# Patient Record
Sex: Male | Born: 1975 | Race: Black or African American | Hispanic: No | Marital: Married | Smoking: Never smoker
Health system: Southern US, Community
[De-identification: ages and names within clinical notes are randomized; demographics above are authoritative.]

## PROBLEM LIST (undated history)

## (undated) HISTORY — PX: HERNIA REPAIR: SHX51

---

## 1998-01-12 ENCOUNTER — Emergency Department (HOSPITAL_COMMUNITY): Admission: EM | Admit: 1998-01-12 | Discharge: 1998-01-12 | Payer: Self-pay | Admitting: Emergency Medicine

## 1998-01-12 ENCOUNTER — Encounter: Payer: Self-pay | Admitting: Emergency Medicine

## 2005-11-24 ENCOUNTER — Ambulatory Visit (HOSPITAL_COMMUNITY): Admission: RE | Admit: 2005-11-24 | Discharge: 2005-11-24 | Payer: Self-pay | Admitting: Internal Medicine

## 2005-11-24 ENCOUNTER — Ambulatory Visit: Payer: Self-pay | Admitting: Internal Medicine

## 2007-01-08 ENCOUNTER — Encounter: Payer: Self-pay | Admitting: Internal Medicine

## 2007-01-08 DIAGNOSIS — M545 Low back pain: Secondary | ICD-10-CM

## 2007-04-02 ENCOUNTER — Emergency Department (HOSPITAL_COMMUNITY): Admission: EM | Admit: 2007-04-02 | Discharge: 2007-04-02 | Payer: Self-pay | Admitting: Emergency Medicine

## 2010-07-15 NOTE — Assessment & Plan Note (Signed)
[facility]                             PRIMARY CARE OFFICE NOTE   Jacob Li, Jacob Li                       MRN:          [id number]  DATE:11/24/2005                            DOB:          06-11-1975    CHIEF COMPLAINT:  New patient to practice.   HISTORY OF PRESENT ILLNESS:  Patient is a 35 year old African-American male,  here to establish primary care.  He has not had a primary care physician in  the past.  His main complaint today is low back pain which he has had  worsening over the last 2 months.  Patient states that he injured his back  when he was in high school during a football accident, and ever since then  has had periodic flare-ups of low back pain.  Patient does not recall any  recent trauma or injury.  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  Patient relays symptoms in his lower back that  occasionally radiate down his left buttock and left leg.  There is some left  leg/buttock numbness.  This occurred once after he walked on a treadmill for  approximately 30 minutes.  In addition, he states that when his bladder is  full and right before defecation he has senses of pressure-like sensation in  his lower back.  Patient denies any lower extremity weakness.  No history of  bowel or bladder incontinence.   He has not taken any over-the-counter medications, has not had an x-ray of  his back in the past.   No other hospitalizations or serious illnesses, does not take any  medications, and DOES NOT REPORT ANY ALLERGIES TO MEDICATIONS.   SOCIAL HISTORY:  Patient is married, has 3 children; ages 18, 47, and 2, and  works for [employer].  There is some mild lifting  duty/responsibilities for employment.   FAMILY HISTORY:  Mother and father are both in their mid-50s.  Father is  known to be hypertensive.  He has a cousin that is diabetic.  No family  history of colon cancer or prostate cancer.   HABITS:  He seldom drinks.  Denies any tobacco use.  No recreational drug  use.   REVIEW OF SYMPTOMS:  No fever or chills.  No HEENT symptoms.  No chest pain,  shortness of breath.  No heartburn, nausea, vomiting, constipation,  diarrhea.  Patient denies any polyuria, polydipsia.  He has not had any  recent blood work to check for diabetes.  He has had issues with obesity for  the last 5 years.   PHYSICAL EXAMINATION:  VITAL SIGNS:  Height 5 feet 10, weight 276 pounds,  temperature 98.7, pulse 91, BP 126/79.  GENERAL:  The patient is a somewhat overweight 35 year old pleasant African-  American male, no apparent distress.  HEENT:  Normocephalic, atraumatic.  Pupils were equal and reactive to light  bilaterally.  Extraocular motility was intact.  Patient was anicteric.  Conjunctiva was within normal limits. Oropharyngeal exam was unremarkable.  NECK:  Somewhat thick but no adenopathy, carotid bruit, or thyromegaly.  CHEST:  Normal respiratory effort.  Chest was clear to auscultation  bilaterally.  No rhonchi, rales or wheezing.  CARDIOVASCULAR:  Regular rate, rhythm.  No significant murmurs, rubs, or  gallops appreciated.  ABDOMEN:  Somewhat protuberant, nontender.  Positive bowel sounds.  No  organomegaly.  MUSCULOSKELETAL:  Patient had full range of motion with lumbar flexion and  extension.  I could not elicit any pain with toe-touching on extension of  his lumbar spine, nor with side-bending or rotation.  Patient did not have  any palpable tenderness to his lumbosacral spine.  The muscle strength was  5/5 throughout.  Reflexes are 0 to 1 upper extremity,  +1 to 2 patellar, and  0 to 1 Achilles, equal and symmetric bilaterally.  Patient had normal  sensation to his lower extremities and had no pain with straight-leg raising  test.  His hamstrings had limited range of motion to approximately 35  degrees bilateral.   IMPRESSION/RECOMMENDATIONS:  1. Chronic intermittent low back  pain.  2. Obesity.  3. Health maintenance.   RECOMMENDATIONS:  Patient is to take occasional over-the-counter  nonsteroidals.  He was also given a prescription for Flexeril 5 mg to be  taken at bedtime.  We will obtain x-ray of his lumbar spine to rule out any  anatomic/bony abnormality.  Followup time will be in approximately 1 to 1-  1/2 weeks.  Patient will likely benefit from physical therapy if there is no  bony abnormality.  We briefly discussed hamstring stretching exercises as  well as abdominal exercises.   We also recommended having screening labs including his cholesterol and  fasting blood sugar before followup visit.       Barbette Hair. Artist Pais, DO      RDY/MedQ  DD:  11/24/2005  DT:  11/27/2005  Job #:  [id number]

## 2010-11-25 ENCOUNTER — Inpatient Hospital Stay (INDEPENDENT_AMBULATORY_CARE_PROVIDER_SITE_OTHER)
Admission: RE | Admit: 2010-11-25 | Discharge: 2010-11-25 | Disposition: A | Discharge status: Home / Self Care | Payer: Self-pay | Source: Ambulatory Visit | Attending: Family Medicine | Admitting: Family Medicine

## 2010-11-25 DIAGNOSIS — M549 Dorsalgia, unspecified: Secondary | ICD-10-CM

## 2010-11-25 DIAGNOSIS — M461 Sacroiliitis, not elsewhere classified: Secondary | ICD-10-CM

## 2014-10-23 ENCOUNTER — Ambulatory Visit: Payer: Self-pay | Admitting: Surgery

## 2014-10-23 NOTE — H&P (Signed)
History of Present Illness Jacob Li. Evetta Renner MD; 10/23/2014 9:54 AM) Patient words: neck lipoma.  The patient is a 39 year old male who presents with a complaint of Mass. This is a healthy 39 year old male referred by Charlies Silvers PA-C for evaluation of a tender lipoma of the right shoulder. The patient states that this has been present for about 6 months and has become slightly larger. It has never been infected. The patient has a lot of tenderness and tightness in both shoulders and occasionally gets tingling in his left hand. 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 I told him that the left hand symptoms are not likely to be related to a lipoma of the right shoulder. Other Problems Gilmer Mor, CMA; 10/23/2014 9:34 AM) Back Pain  Past Surgical History Gilmer Mor, CMA; 10/23/2014 9:34 AM) Oral Surgery Ventral / Umbilical Hernia Surgery Bilateral.  Diagnostic Studies History Gilmer Mor, CMA; 10/23/2014 9:34 AM) Colonoscopy >10 years ago  Allergies Gilmer Mor, CMA; 10/23/2014 9:35 AM) No Known Drug Allergies 10/23/2014  Medication History Wynona Luna, MD; 10/23/2014 9:54 AM) No Current Medications Medications Reconciled  Social History Gilmer Mor, CMA; 10/23/2014 9:34 AM) Alcohol use Occasional alcohol use. Illicit drug use Recently quit drug use. No caffeine use Tobacco use Former smoker.  Family History Gilmer Mor, CMA; 10/23/2014 9:34 AM) Hypertension Father.     Review of Systems Lamar Laundry Bynum CMA; 10/23/2014 9:34 AM) General Not Present- Appetite Loss, Chills, Fatigue, Fever, Night Sweats, Weight Gain and Weight Loss. Skin Not Present- Change in Wart/Mole, Dryness, Hives, Jaundice, New Lesions, Non-Healing Wounds, Rash and Ulcer. HEENT Present- Seasonal Allergies. Not Present- Earache, Hearing Loss, Hoarseness, Nose Bleed, Oral Ulcers, Ringing in the Ears, Sinus Pain, Sore Throat, Visual Disturbances,  Wears glasses/contact lenses and Yellow Eyes. Respiratory Present- Snoring. Not Present- Bloody sputum, Chronic Cough, Difficulty Breathing and Wheezing. Breast Not Present- Breast Mass, Breast Pain, Nipple Discharge and Skin Changes. Cardiovascular Not Present- Chest Pain, Difficulty Breathing Lying Down, Leg Cramps, Palpitations, Rapid Heart Rate, Shortness of Breath and Swelling of Extremities. Gastrointestinal Not Present- Abdominal Pain, Bloating, Bloody Stool, Change in Bowel Habits, Chronic diarrhea, Constipation, Difficulty Swallowing, Excessive gas, Gets full quickly at meals, Hemorrhoids, Indigestion, Nausea, Rectal Pain and Vomiting. Male Genitourinary Not Present- Blood in Urine, Change in Urinary Stream, Frequency, Impotence, Nocturia, Painful Urination, Urgency and Urine Leakage. Musculoskeletal Present- Joint Stiffness. Not Present- Back Pain, Joint Pain, Muscle Pain, Muscle Weakness and Swelling of Extremities. Neurological Not Present- Decreased Memory, Fainting, Headaches, Numbness, Seizures, Tingling, Tremor, Trouble walking and Weakness. Psychiatric Not Present- Anxiety, Bipolar, Change in Sleep Pattern, Depression, Fearful and Frequent crying. Endocrine Not Present- Cold Intolerance, Excessive Hunger, Hair Changes, Heat Intolerance, Hot flashes and New Diabetes. Hematology Not Present- Easy Bruising, Excessive bleeding, Gland problems, HIV and Persistent Infections.  Vitals (Sonya Bynum CMA; 10/23/2014 9:35 AM) 10/23/2014 9:34 AM Weight: 252.6 lb Height: 69in Body Surface Area: 2.36 m Body Mass Index: 37.3 kg/m Temp.: 60F(Temporal)  Pulse: 81 (Regular)  BP: 134/78 (Sitting, Left Arm, Standard)     Physical Exam Molli Hazard K. Rc Amison MD; 10/23/2014 9:54 AM)  The physical exam findings are as follows: Note:WDWN in NAD HEENT: EOMI, sclera anicteric Neck: No masses, no thyromegaly Shoulder - right over trapezius; 3 cm subcutaneous mass; no overlying skin  changes; no inflammation Lungs: CTA bilaterally; normal respiratory effort CV: Regular rate and rhythm; no murmurs Abd: +bowel sounds, soft, non-tender, no masses Ext: Well-perfused;  no edema Skin: Warm, dry; no sign of jaundice    Assessment & Plan Jacob Li. 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 The surgical procedure has been discussed with the patient. Potential risks, benefits, alternative treatments, and expected outcomes have been explained. All of the patient's questions at this time have been answered. The likelihood of reaching the patient's treatment goal is good. The patient understand the proposed surgical procedure and wishes to proceed.  Jacob Li. Corliss Skains, MD, [facility]  General/ Trauma Surgery  10/23/2014 9:55 AM

## 2016-02-28 HISTORY — PX: CYST REMOVAL NECK: SHX6281

## 2018-08-14 ENCOUNTER — Emergency Department (HOSPITAL_COMMUNITY)
Admission: EM | Admit: 2018-08-14 | Discharge: 2018-08-14 | Disposition: A | Discharge status: Home / Self Care | Payer: Managed Care, Other (non HMO) | Attending: Emergency Medicine | Admitting: Emergency Medicine

## 2018-08-14 ENCOUNTER — Other Ambulatory Visit: Payer: Self-pay

## 2018-08-14 ENCOUNTER — Encounter (HOSPITAL_COMMUNITY): Payer: Self-pay | Admitting: Emergency Medicine

## 2018-08-14 DIAGNOSIS — M545 Low back pain: Secondary | ICD-10-CM | POA: Diagnosis present

## 2018-08-14 DIAGNOSIS — M5431 Sciatica, right side: Secondary | ICD-10-CM | POA: Diagnosis not present

## 2018-08-14 MED ORDER — PREDNISONE 10 MG (21) PO TBPK: ORAL_TABLET | Freq: Every day | ORAL | 0 refills | Status: AC

## 2018-08-14 NOTE — ED Notes (Signed)
Discharge instructions and prescription discussed with Pt. Pt verbalized understanding. Pt stable and ambulatory.    

## 2018-08-14 NOTE — ED Provider Notes (Addendum)
[facility] EMERGENCY DEPARTMENT Provider Note   CSN: [id number] Arrival date & time: 08/14/18  1705    History   Chief Complaint Chief Complaint  Patient presents with  . Back Pain    HPI Jacob Li is a 43 y.o. male.     HPI  Pt is a 43 y/o male with a h/o low back pain who presents to the ED today c/o right lower back pain that has been present for the last 8 days. IT has been constant since onset. Pain rated 5-6/10. Pain is worse with standing.  Pain radiates down the RLE and he does have some tingling to the RLE. States he has had no recent heavy lifting or trauma but he did recently get a motorcycle and he wonders if this could be contributing to his sxs.  States that he was seen at urgent care earlier this week and was given a shot of pain medicine and muscle relaxers without relief.  Pt denies any numbness/weakness to the BLE. Denies saddle anesthesia. Denies loss of control of bowels or bladder. No urinary retention. No fevers. Denies a h/o IVDU. Denies a h/o CA or recent unintended weight loss.  History reviewed. No pertinent past medical history.  Patient Active Problem List   Diagnosis Date Noted  . BACK PAIN, LUMBAR 01/08/2007    Past Surgical History:  Procedure Laterality Date  . CYST REMOVAL NECK  2018  . HERNIA REPAIR     as a baby         Home Medications    Prior to Admission medications   Medication Sig Start Date End Date Taking? Authorizing Provider  predniSONE (STERAPRED UNI-PAK 21 TAB) 10 MG (21) TBPK tablet Take by mouth daily. Take 6 tabs by mouth daily  for 2 days, then 5 tabs for 2 days, then 4 tabs for 2 days, then 3 tabs for 2 days, 2 tabs for 2 days, then 1 tab by mouth daily for 2 days 08/14/18   Chellsie Gomer S, PA-C    Family History History reviewed. No pertinent family history.  Social History Social History   Tobacco Use  . Smoking status: Never Smoker  . Smokeless tobacco: Never Used  Substance Use  Topics  . Alcohol use: Not Currently  . Drug use: Never     Allergies   Patient has no known allergies.   Review of Systems Review of Systems  Constitutional: Negative for fever.  Respiratory: Negative for shortness of breath.   Gastrointestinal: Negative for abdominal pain, constipation, diarrhea, nausea and vomiting.  Genitourinary: Negative for dysuria, frequency, hematuria and urgency.  Musculoskeletal: Positive for back pain. Negative for gait problem.  Neurological: Negative for weakness and numbness.     Physical Exam Updated Vital Signs BP (!) 139/91 (BP Location: Right Arm)   Pulse (!) 115   Temp 98.9 F (37.2 C) (Oral)   Resp 16   Ht 5' 9.5" (1.765 m)   Wt 113.4 kg   SpO2 99%   BMI 36.39 kg/m   Physical Exam Vitals signs and nursing note reviewed.  Constitutional:      Appearance: He is well-developed.  HENT:     Head: Normocephalic and atraumatic.  Eyes:     Conjunctiva/sclera: Conjunctivae normal.  Neck:     Musculoskeletal: Neck supple.  Cardiovascular:     Rate and Rhythm: Normal rate and regular rhythm.     Pulses: Normal pulses.     Heart sounds: Normal heart  sounds. No murmur.  Pulmonary:     Effort: Pulmonary effort is normal. No respiratory distress.     Breath sounds: Normal breath sounds.  Abdominal:     Palpations: Abdomen is soft.     Tenderness: There is no abdominal tenderness.  Musculoskeletal:     Comments: No midline lumbar TTP. TTP to the right sciatic notch the reproduces pain. 5/5 strength to the BLE. Reports tingling sensation to the RLE.   Skin:    General: Skin is warm and dry.  Neurological:     Mental Status: He is alert.      ED Treatments / Results  Labs (all labs ordered are listed, but only abnormal results are displayed) Labs Reviewed - No data to display  EKG None  Radiology No results found.  Procedures Procedures (including critical care time)  Medications Ordered in ED Medications - No data to  display   Initial Impression / Assessment and Plan / ED Course  I have reviewed the triage vital signs and the nursing notes.  Pertinent labs & imaging results that were available during my care of the patient were reviewed by me and considered in my medical decision making (see chart for details).    Final Clinical Impressions(s) / ED Diagnoses   Final diagnoses:  Sciatica of right side    Normal neurological exam, no evidence of urinary incontinence or retention, pain is consistently reproducible. There is no evidence of AAA or concern for dissection at this time.   Patient can walk but states is painful.  No loss of bowel or bladder control.  No concern for cauda equina.  No fever, night sweats, weight loss, h/o cancer, IVDU.  Pain treated here in the department with adequate improvement. RICE protocol and pain medicine indicated and discussed with patient. Will give steroid taper as well. I have also discussed reasons to return immediately to the ER.  Patient expresses understanding and agrees with plan.    ED Discharge Orders         Ordered    predniSONE (STERAPRED UNI-PAK 21 TAB) 10 MG (21) TBPK tablet  Daily     08/14/18 1821           Karrie MeresCouture, Jorene Kaylor S, PA-C 08/14/18 1822    Karrie MeresCouture, Hamp Moreland S, PA-C 08/14/18 1823    Melene PlanFloyd, Dan, DO 08/14/18 1855

## 2018-08-14 NOTE — Discharge Instructions (Addendum)
You may alternate taking Tylenol and Ibuprofen as needed for pain control. You may take 400-600 mg of ibuprofen every 6 hours and 500-1000 mg of Tylenol every 6 hours. Do not exceed 4000 mg of Tylenol daily as this can lead to liver damage. Also, make sure to take Ibuprofen with meals as it can cause an upset stomach. Do not take other NSAIDs while taking Ibuprofen such as (Aleve, Naprosyn, Aspirin, Celebrex, etc) and do not take more than the prescribed dose as this can lead to ulcers and bleeding in your GI tract. You may use warm and cold compresses to help with your symptoms.  ° °Please take prednisone as directed.  ° °Please follow up with your primary doctor within the next 7-10 days for re-evaluation and further treatment of your symptoms.  ° °Please return to the ER sooner if you have any new or worsening symptoms. ° °

## 2018-08-14 NOTE — ED Triage Notes (Addendum)
Pt reports a "pinched nerve"  with tingling on right side of his lower back and down his right leg x 1 week. Was given muscle relaxer shot [facility] but hasn't gotten any better. Has tried ice and heat. Denies injury.

## 2020-11-29 ENCOUNTER — Emergency Department (HOSPITAL_COMMUNITY): Payer: 59

## 2020-11-29 ENCOUNTER — Emergency Department (HOSPITAL_COMMUNITY)
Admission: EM | Admit: 2020-11-29 | Discharge: 2020-11-29 | Disposition: A | Discharge status: Home / Self Care | Payer: 59 | Attending: Emergency Medicine | Admitting: Emergency Medicine

## 2020-11-29 ENCOUNTER — Encounter (HOSPITAL_COMMUNITY): Payer: Self-pay

## 2020-11-29 DIAGNOSIS — Z23 Encounter for immunization: Secondary | ICD-10-CM | POA: Diagnosis not present

## 2020-11-29 DIAGNOSIS — S61412A Laceration without foreign body of left hand, initial encounter: Secondary | ICD-10-CM | POA: Insufficient documentation

## 2020-11-29 DIAGNOSIS — Y990 Civilian activity done for income or pay: Secondary | ICD-10-CM | POA: Diagnosis not present

## 2020-11-29 DIAGNOSIS — S6992XA Unspecified injury of left wrist, hand and finger(s), initial encounter: Secondary | ICD-10-CM | POA: Diagnosis present

## 2020-11-29 DIAGNOSIS — W260XXA Contact with knife, initial encounter: Secondary | ICD-10-CM | POA: Insufficient documentation

## 2020-11-29 MED ORDER — LIDOCAINE HCL (PF) 1 % IJ SOLN
5.0000 mL | Freq: Once | INTRAMUSCULAR | Status: AC
  Administered 2020-11-29: 5 mL
  Filled 2020-11-29: qty 30

## 2020-11-29 MED ORDER — BACITRACIN ZINC 500 UNIT/GM EX OINT: TOPICAL_OINTMENT | Freq: Two times a day (BID) | CUTANEOUS | Status: DC

## 2020-11-29 MED ORDER — TETANUS-DIPHTH-ACELL PERTUSSIS 5-2.5-18.5 LF-MCG/0.5 IM SUSY
0.5000 mL | PREFILLED_SYRINGE | Freq: Once | INTRAMUSCULAR | Status: AC
  Administered 2020-11-29: 0.5 mL via INTRAMUSCULAR
  Filled 2020-11-29: qty 0.5

## 2020-11-29 NOTE — ED Notes (Signed)
Patient transported to X-ray 

## 2020-11-29 NOTE — ED Provider Notes (Signed)
[facility]-EMERGENCY DEPT Provider Note   CSN: [id number] Arrival date & time: 11/29/20  3614     History Chief Complaint  Patient presents with   Laceration    Jacob Li is a 45 y.o. male presenting to the ED with a chief complaint of nondominant left palm laceration that occurred 2 hours ago while at work.  States that he was using a "fairly clean" knife at work when he accidentally lacerated his left palm.  He is unsure of last tetanus shot.  He is denying any numbness, changes to range of motion or anticoagulant use.  HPI     History reviewed. No pertinent past medical history.  Patient Active Problem List   Diagnosis Date Noted   BACK PAIN, LUMBAR 01/08/2007    Past Surgical History:  Procedure Laterality Date   CYST REMOVAL NECK  2018   HERNIA REPAIR     as a baby        History reviewed. No pertinent family history.  Social History   Tobacco Use   Smoking status: Never   Smokeless tobacco: Never  Substance Use Topics   Alcohol use: Not Currently   Drug use: Never    Home Medications Prior to Admission medications   Medication Sig Start Date End Date Taking? Authorizing Provider  predniSONE (STERAPRED UNI-PAK 21 TAB) 10 MG (21) TBPK tablet Take by mouth daily. Take 6 tabs by mouth daily  for 2 days, then 5 tabs for 2 days, then 4 tabs for 2 days, then 3 tabs for 2 days, 2 tabs for 2 days, then 1 tab by mouth daily for 2 days 08/14/18   Couture, Cortni S, PA-C    Allergies    Patient has no known allergies.  Review of Systems   Review of Systems  Constitutional:  Negative for chills and fever.  Skin:  Positive for wound.  Neurological:  Negative for weakness.   Physical Exam Updated Vital Signs BP (!) 153/99 (BP Location: Right Arm)   Pulse 86   Temp 98.9 F (37.2 C) (Oral)   Resp 15   Ht 5' 9.5" (1.765 m)   Wt 108.9 kg   SpO2 98%   BMI 34.93 kg/m   Physical Exam Vitals and nursing note reviewed.   Constitutional:      General: He is not in acute distress.    Appearance: He is well-developed. He is not diaphoretic.  HENT:     Head: Normocephalic and atraumatic.  Eyes:     General: No scleral icterus.    Conjunctiva/sclera: Conjunctivae normal.  Pulmonary:     Effort: Pulmonary effort is normal. No respiratory distress.  Musculoskeletal:     Cervical back: Normal range of motion.  Skin:    Findings: Laceration present. No rash.     Comments: Approximate 3 cm laceration to the left palm as seen in the image.  Bleeding controlled.  No visible tendon.  Normal active and passive range of motion of digits of left hand.  2+ radial pulse palpated.  Neurological:     Mental Status: He is alert.     ED Results / Procedures / Treatments   Labs (all labs ordered are listed, but only abnormal results are displayed) Labs Reviewed - No data to display  EKG None  Radiology DG Hand Complete Left  Result Date: 11/29/2020 CLINICAL DATA:  Pocket knife cut on anterior aspect of hand. Hand wrapped, unable to fan fingers EXAM: LEFT HAND - COMPLETE  3 VIEW COMPARISON:  None. FINDINGS: No fracture or bone lesion. Joints are normally spaced and aligned. Soft tissues are unremarkable.  No radiopaque foreign body. IMPRESSION: 1. No fracture, joint abnormality or radiopaque foreign body. Electronically Signed   By: Amie Portland M.D.   On: 11/29/2020 08:15    Procedures .Marland KitchenLaceration Repair  Date/Time: 11/29/2020 8:28 AM Performed by: Dietrich Pates, PA-C Authorized by: Dietrich Pates, PA-C   Consent:    Consent obtained:  Verbal   Consent given by:  Patient   Risks discussed:  Infection, pain and poor wound healing Universal protocol:    Patient identity confirmed:  Verbally with patient Anesthesia:    Anesthesia method:  Local infiltration   Local anesthetic:  Lidocaine 1% w/o epi Laceration details:    Location:  Hand   Hand location:  L palm   Length (cm):  3 Pre-procedure details:     Preparation:  Patient was prepped and draped in usual sterile fashion Exploration:    Hemostasis achieved with:  Direct pressure   Imaging obtained: x-ray     Imaging outcome: foreign body not noted     Wound exploration: wound explored through full range of motion   Treatment:    Area cleansed with:  Saline   Amount of cleaning:  Extensive   Irrigation solution:  Sterile saline   Irrigation method:  Syringe Skin repair:    Repair method:  Sutures   Suture size:  4-0   Wound skin closure material used: vicryl rapide.   Suture technique:  Simple interrupted   Number of sutures:  3 Approximation:    Approximation:  Close Post-procedure details:    Dressing:  Antibiotic ointment   Procedure completion:  Tolerated well, no immediate complications   Medications Ordered in ED Medications  lidocaine (PF) (XYLOCAINE) 1 % injection 5 mL (has no administration in time range)  bacitracin ointment (has no administration in time range)  Tdap (BOOSTRIX) injection 0.5 mL (0.5 mLs Intramuscular Given 11/29/20 0739)    ED Course  I have reviewed the triage vital signs and the nursing notes.  Pertinent labs & imaging results that were available during my care of the patient were reviewed by me and considered in my medical decision making (see chart for details).    MDM Rules/Calculators/A&P                           Pressure irrigation performed. Wound explored and base of wound visualized in a bloodless field without evidence of foreign body.  X-rays negative.  Laceration occurred < 8 hours prior to repair which was well tolerated.Tdap updated.  Pt has  no comorbidities to effect normal wound healing. Patient counseled on wound care. Patient counseled on need to return or see PCP/urgent care for signs of infection.  Patient was urged to return to the Emergency Department urgently with worsening pain, swelling, expanding erythema especially if it streaks away from the affected area, fever, or if  they have any other concerns. Patient verbalized understanding.   All imaging, if done today, including plain films, CT scans, and ultrasounds, independently reviewed by me, and interpretations confirmed via formal radiology reads.  Patient is hemodynamically stable, in NAD, and able to ambulate in the ED. Evaluation does not show pathology that would require ongoing emergent intervention or inpatient treatment. I explained the diagnosis to the patient. Pain has been managed and has no complaints prior to discharge. Patient is comfortable with  above plan and is stable for discharge at this time. All questions were answered prior to disposition. Strict return precautions for returning to the ED were discussed. Encouraged follow up with PCP.   An After Visit Summary was printed and given to the patient.   Portions of this note were generated with Scientist, clinical (histocompatibility and immunogenetics). Dictation errors may occur despite best attempts at proofreading.  Final Clinical Impression(s) / ED Diagnoses Final diagnoses:  Laceration of left hand without foreign body, initial encounter    Rx / DC Orders ED Discharge Orders     None        Dietrich Pates, PA-C 11/29/20 0626    Wynetta Fines, MD 11/29/20 1454

## 2020-11-29 NOTE — ED Notes (Signed)
Irrigated/cleaned wound.

## 2020-11-29 NOTE — ED Notes (Signed)
Cleaned and irrigated wound.

## 2020-11-29 NOTE — Discharge Instructions (Addendum)
Follow instructions with wound care. Your sutures are absorbable and will be absorbed when they are ready. Return to the ER for signs of infection including redness, swelling, pus draining from the area, red streaks or fever.

## 2020-11-29 NOTE — ED Triage Notes (Signed)
Pt arrived via POV, laceration to left palm, no blood thinners. Bleeding controlled. Last tetanus unknown.

## 2022-10-27 IMAGING — CR DG HAND COMPLETE 3+V*L*
3 series · 3 of 3 positions shown · non-contrast
Comparison: None.

CLINICAL DATA: Pocket knife cut on anterior aspect of hand. Hand
wrapped, unable to fan fingers

EXAM:
LEFT HAND - COMPLETE 3 VIEW

[x hand pa left]
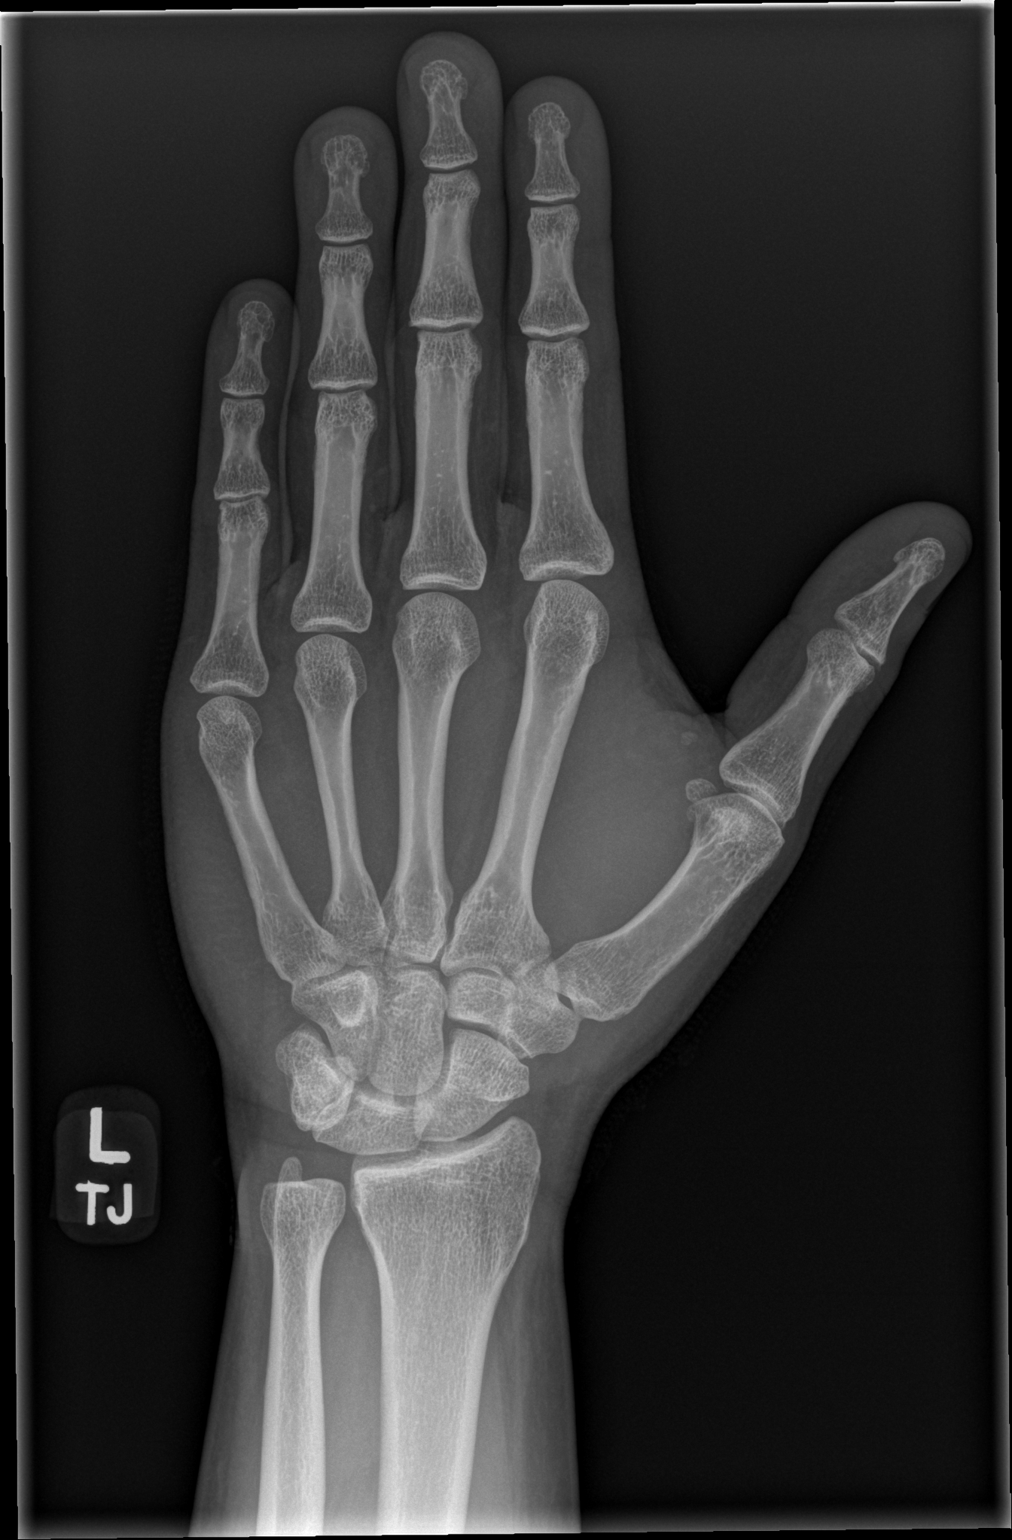

[x hand obl left]
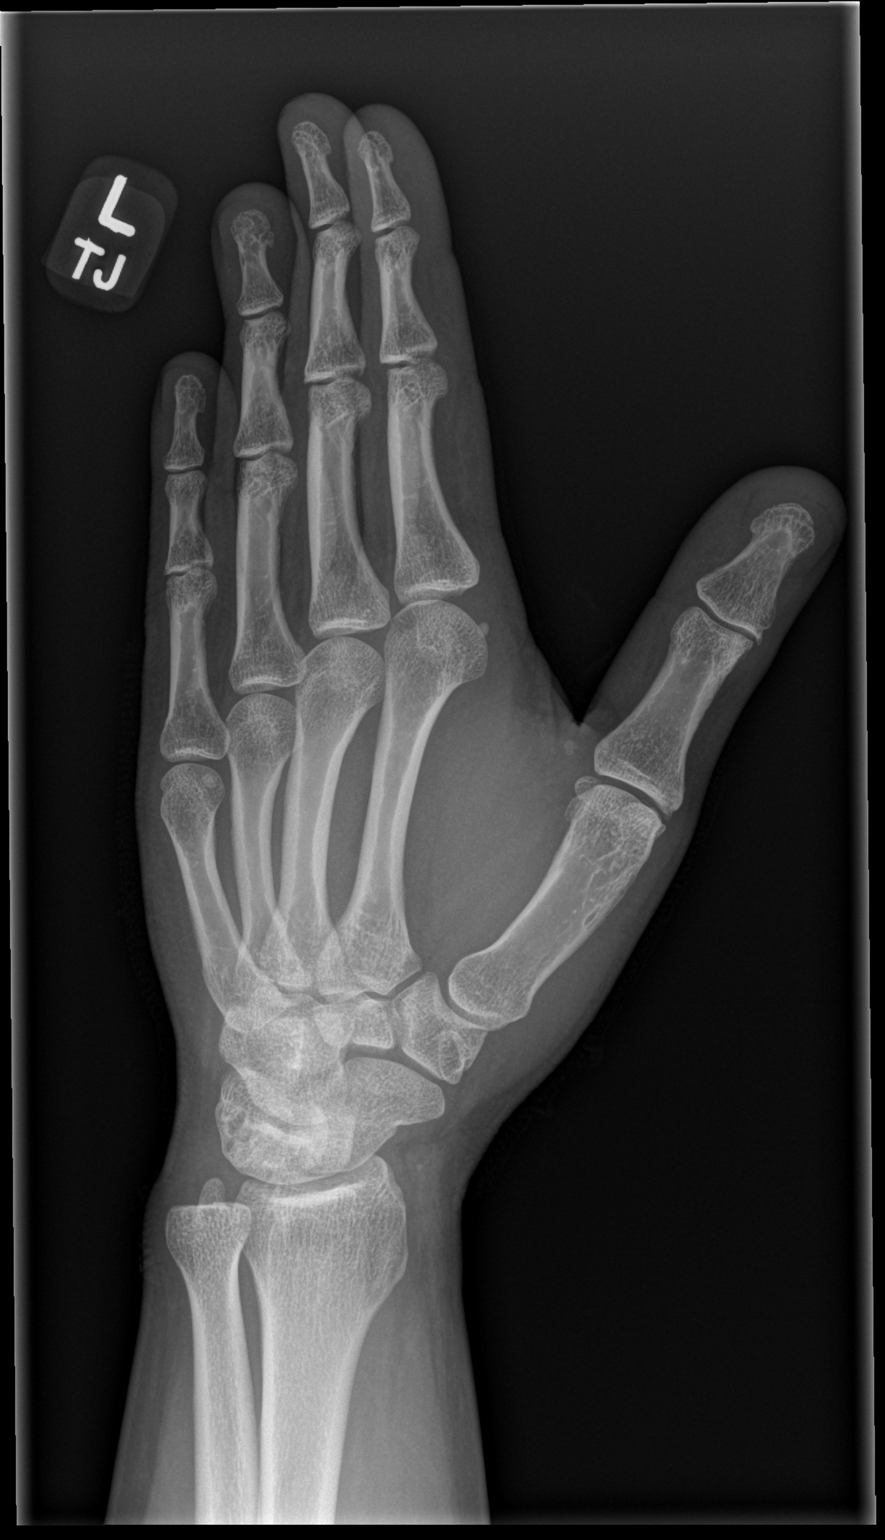

[x hand lat left]
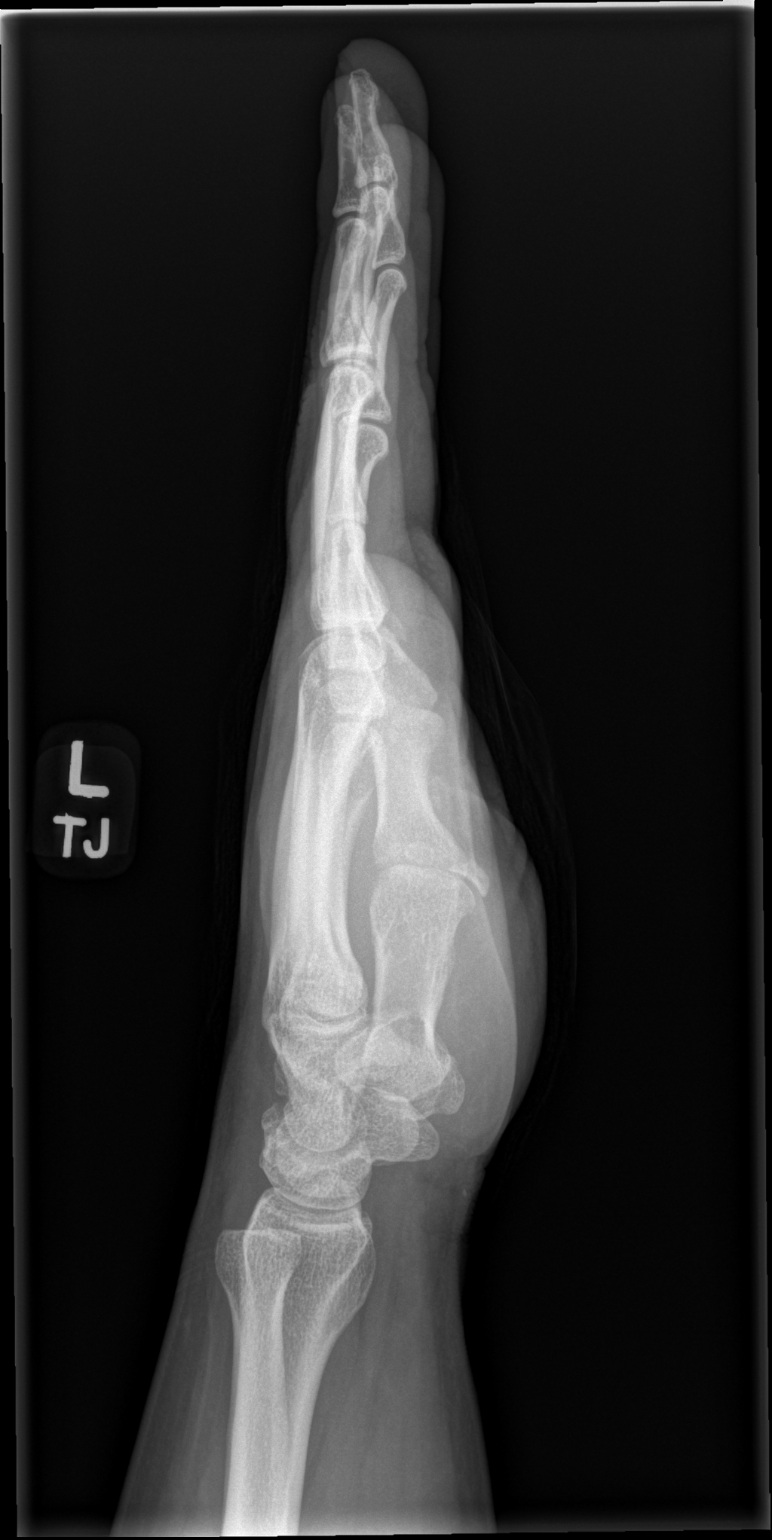

[3 of 3 positions shown; findings below may reference images not displayed]

FINDINGS: No fracture or bone lesion.

Joints are normally spaced and aligned.

Soft tissues are unremarkable.  No radiopaque foreign body.
IMPRESSION: 1. No fracture, joint abnormality or radiopaque foreign body.
